# Patient Record
Sex: Female | Born: 1970 | Race: Black or African American | Hispanic: No | Marital: Single | Smoking: Never smoker
Health system: Southern US, Community
[De-identification: ages and names within clinical notes are randomized; demographics above are authoritative.]

## PROBLEM LIST (undated history)

## (undated) DIAGNOSIS — C50919 Malignant neoplasm of unspecified site of unspecified female breast: Secondary | ICD-10-CM

## (undated) HISTORY — PX: BREAST SURGERY: SHX581

## (undated) HISTORY — PX: BREAST EXCISIONAL BIOPSY: SUR124

---

## 2014-12-03 ENCOUNTER — Encounter (HOSPITAL_COMMUNITY): Payer: Self-pay | Admitting: *Deleted

## 2014-12-03 ENCOUNTER — Emergency Department (HOSPITAL_COMMUNITY): Payer: BLUE CROSS/BLUE SHIELD

## 2014-12-03 ENCOUNTER — Inpatient Hospital Stay (HOSPITAL_COMMUNITY)
Admission: EM | Admit: 2014-12-03 | Discharge: 2014-12-12 | DRG: 347 | Disposition: A | Discharge status: Home Health | Payer: BLUE CROSS/BLUE SHIELD | Attending: General Surgery | Admitting: General Surgery

## 2014-12-03 ENCOUNTER — Observation Stay (HOSPITAL_COMMUNITY): Payer: BLUE CROSS/BLUE SHIELD

## 2014-12-03 DIAGNOSIS — K458 Other specified abdominal hernia without obstruction or gangrene: Secondary | ICD-10-CM | POA: Diagnosis present

## 2014-12-03 DIAGNOSIS — M79672 Pain in left foot: Secondary | ICD-10-CM

## 2014-12-03 DIAGNOSIS — S36438A Laceration of other part of small intestine, initial encounter: Principal | ICD-10-CM | POA: Diagnosis present

## 2014-12-03 DIAGNOSIS — S36409A Unspecified injury of unspecified part of small intestine, initial encounter: Secondary | ICD-10-CM | POA: Diagnosis present

## 2014-12-03 DIAGNOSIS — Z853 Personal history of malignant neoplasm of breast: Secondary | ICD-10-CM

## 2014-12-03 DIAGNOSIS — F1010 Alcohol abuse, uncomplicated: Secondary | ICD-10-CM | POA: Diagnosis present

## 2014-12-03 DIAGNOSIS — K661 Hemoperitoneum: Secondary | ICD-10-CM | POA: Diagnosis present

## 2014-12-03 DIAGNOSIS — E876 Hypokalemia: Secondary | ICD-10-CM | POA: Diagnosis not present

## 2014-12-03 DIAGNOSIS — R748 Abnormal levels of other serum enzymes: Secondary | ICD-10-CM | POA: Diagnosis present

## 2014-12-03 DIAGNOSIS — K429 Umbilical hernia without obstruction or gangrene: Secondary | ICD-10-CM | POA: Diagnosis present

## 2014-12-03 DIAGNOSIS — R109 Unspecified abdominal pain: Secondary | ICD-10-CM

## 2014-12-03 DIAGNOSIS — S36899A Unspecified injury of other intra-abdominal organs, initial encounter: Secondary | ICD-10-CM | POA: Diagnosis present

## 2014-12-03 DIAGNOSIS — Z7289 Other problems related to lifestyle: Secondary | ICD-10-CM | POA: Diagnosis present

## 2014-12-03 DIAGNOSIS — R7401 Elevation of levels of liver transaminase levels: Secondary | ICD-10-CM

## 2014-12-03 DIAGNOSIS — Z789 Other specified health status: Secondary | ICD-10-CM | POA: Diagnosis present

## 2014-12-03 DIAGNOSIS — M25472 Effusion, left ankle: Secondary | ICD-10-CM | POA: Diagnosis present

## 2014-12-03 DIAGNOSIS — D62 Acute posthemorrhagic anemia: Secondary | ICD-10-CM | POA: Diagnosis not present

## 2014-12-03 DIAGNOSIS — R740 Nonspecific elevation of levels of transaminase and lactic acid dehydrogenase [LDH]: Secondary | ICD-10-CM | POA: Diagnosis present

## 2014-12-03 HISTORY — DX: Malignant neoplasm of unspecified site of unspecified female breast: C50.919

## 2014-12-03 LAB — URINE MICROSCOPIC-ADD ON

## 2014-12-03 LAB — CBC
HCT: 36.6 % (ref 36.0–46.0)
HEMATOCRIT: 34.8 % — AB (ref 36.0–46.0)
HEMOGLOBIN: 11.9 g/dL — AB (ref 12.0–15.0)
HEMOGLOBIN: 11.3 g/dL — AB (ref 12.0–15.0)
MCH: 28.2 pg (ref 26.0–34.0)
MCH: 27.9 pg (ref 26.0–34.0)
MCHC: 32.5 g/dL (ref 30.0–36.0)
MCHC: 32.5 g/dL (ref 30.0–36.0)
MCV: 86.7 fL (ref 78.0–100.0)
MCV: 85.9 fL (ref 78.0–100.0)
Platelets: 188 10*3/uL (ref 150–400)
Platelets: 165 10*3/uL (ref 150–400)
RBC: 4.22 MIL/uL (ref 3.87–5.11)
RBC: 4.05 MIL/uL (ref 3.87–5.11)
RDW: 13.3 % (ref 11.5–15.5)
RDW: 13.3 % (ref 11.5–15.5)
WBC: 8.4 10*3/uL (ref 4.0–10.5)
WBC: 2.8 10*3/uL — AB (ref 4.0–10.5)

## 2014-12-03 LAB — COMPREHENSIVE METABOLIC PANEL
ALK PHOS: 127 U/L — AB (ref 39–117)
ALT: 37 U/L — AB (ref 0–35)
AST: 61 U/L — ABNORMAL HIGH (ref 0–37)
Albumin: 3.6 g/dL (ref 3.5–5.2)
Anion gap: 20 — ABNORMAL HIGH (ref 5–15)
BUN: 14 mg/dL (ref 6–23)
CO2: 17 mEq/L — ABNORMAL LOW (ref 19–32)
Calcium: 9.4 mg/dL (ref 8.4–10.5)
Chloride: 102 mEq/L (ref 96–112)
Creatinine, Ser: 0.82 mg/dL (ref 0.50–1.10)
GFR calc Af Amer: >90 mL/min (ref >90)
GFR calc non Af Amer: 86 mL/min — ABNORMAL LOW (ref >90)
Glucose, Bld: 171 mg/dL — ABNORMAL HIGH (ref 70–99)
Potassium: 3.2 mEq/L — ABNORMAL LOW (ref 3.7–5.3)
SODIUM: 139 meq/L (ref 137–147)
TOTAL PROTEIN: 6.9 g/dL (ref 6.0–8.3)

## 2014-12-03 LAB — URINALYSIS, ROUTINE W REFLEX MICROSCOPIC
Bilirubin Urine: NEGATIVE
Glucose, UA: NEGATIVE mg/dL
KETONES UR: NEGATIVE mg/dL
LEUKOCYTES UA: NEGATIVE
NITRITE: NEGATIVE
Protein, ur: NEGATIVE mg/dL
Specific Gravity, Urine: 1.007 (ref 1.005–1.030)
Urobilinogen, UA: 0.2 mg/dL (ref 0.0–1.0)
pH: 5 (ref 5.0–8.0)

## 2014-12-03 LAB — PROTIME-INR
INR: 1.04 (ref 0.00–1.49)
Prothrombin Time: 13.7 seconds (ref 11.6–15.2)

## 2014-12-03 LAB — I-STAT CG4 LACTIC ACID, ED: Lactic Acid, Venous: 2.88 mmol/L — ABNORMAL HIGH (ref 0.5–2.2)

## 2014-12-03 LAB — SAMPLE TO BLOOD BANK

## 2014-12-03 LAB — ETHANOL: Alcohol, Ethyl (B): 54 mg/dL — ABNORMAL HIGH (ref 0–11)

## 2014-12-03 MED ORDER — POTASSIUM CHLORIDE IN NACL 20-0.9 MEQ/L-% IV SOLN
INTRAVENOUS | Status: DC
  Administered 2014-12-03 – 2014-12-10 (×15): via INTRAVENOUS
  Filled 2014-12-03 (×23): qty 1000

## 2014-12-03 MED ORDER — MORPHINE SULFATE 2 MG/ML IJ SOLN
1.0000 mg | INTRAMUSCULAR | Status: DC | PRN
  Administered 2014-12-03 (×3): 2 mg via INTRAVENOUS
  Administered 2014-12-04: 4 mg via INTRAVENOUS
  Administered 2014-12-04 – 2014-12-06 (×2): 2 mg via INTRAVENOUS
  Administered 2014-12-06 – 2014-12-07 (×7): 4 mg via INTRAVENOUS
  Administered 2014-12-08 – 2014-12-09 (×5): 2 mg via INTRAVENOUS
  Administered 2014-12-10: 4 mg via INTRAVENOUS
  Administered 2014-12-11: 2 mg via INTRAVENOUS
  Filled 2014-12-03: qty 2
  Filled 2014-12-03 (×5): qty 1
  Filled 2014-12-03 (×4): qty 2
  Filled 2014-12-03 (×3): qty 1
  Filled 2014-12-03 (×3): qty 2
  Filled 2014-12-03: qty 1
  Filled 2014-12-03 (×2): qty 2
  Filled 2014-12-03 (×2): qty 1

## 2014-12-03 MED ORDER — MORPHINE SULFATE 4 MG/ML IJ SOLN
4.0000 mg | Freq: Once | INTRAMUSCULAR | Status: AC
  Administered 2014-12-03: 4 mg via INTRAVENOUS
  Filled 2014-12-03: qty 1

## 2014-12-03 MED ORDER — METOCLOPRAMIDE HCL 5 MG/ML IJ SOLN
10.0000 mg | Freq: Once | INTRAMUSCULAR | Status: AC
  Administered 2014-12-03: 10 mg via INTRAVENOUS
  Filled 2014-12-03: qty 2

## 2014-12-03 MED ORDER — HYDROCODONE-ACETAMINOPHEN 5-325 MG PO TABS
1.0000 | ORAL_TABLET | Freq: Four times a day (QID) | ORAL | Status: DC | PRN
  Administered 2014-12-03 – 2014-12-04 (×2): 2 via ORAL
  Filled 2014-12-03 (×2): qty 2

## 2014-12-03 MED ORDER — PANTOPRAZOLE SODIUM 40 MG IV SOLR
40.0000 mg | Freq: Every day | INTRAVENOUS | Status: DC
  Administered 2014-12-05 – 2014-12-07 (×3): 40 mg via INTRAVENOUS
  Filled 2014-12-03 (×8): qty 40

## 2014-12-03 MED ORDER — HYDROMORPHONE HCL 1 MG/ML IJ SOLN
1.0000 mg | Freq: Once | INTRAMUSCULAR | Status: AC
  Administered 2014-12-03: 1 mg via INTRAVENOUS
  Filled 2014-12-03 (×2): qty 1

## 2014-12-03 MED ORDER — ONDANSETRON HCL 4 MG/2ML IJ SOLN
4.0000 mg | Freq: Four times a day (QID) | INTRAMUSCULAR | Status: DC | PRN
  Administered 2014-12-09 (×2): 4 mg via INTRAVENOUS
  Filled 2014-12-03 (×2): qty 2

## 2014-12-03 MED ORDER — IOHEXOL 300 MG/ML  SOLN
100.0000 mL | Freq: Once | INTRAMUSCULAR | Status: AC | PRN
  Administered 2014-12-03: 100 mL via INTRAVENOUS

## 2014-12-03 MED ORDER — SODIUM CHLORIDE 0.9 % IV SOLN
1000.0000 mL | Freq: Once | INTRAVENOUS | Status: AC
  Administered 2014-12-03: 1000 mL via INTRAVENOUS

## 2014-12-03 MED ORDER — PROMETHAZINE HCL 25 MG/ML IJ SOLN
25.0000 mg | Freq: Once | INTRAMUSCULAR | Status: AC
  Administered 2014-12-03: 25 mg via INTRAVENOUS
  Filled 2014-12-03 (×2): qty 1

## 2014-12-03 MED ORDER — DIPHENHYDRAMINE HCL 50 MG/ML IJ SOLN
25.0000 mg | Freq: Once | INTRAMUSCULAR | Status: AC
  Administered 2014-12-03: 25 mg via INTRAVENOUS
  Filled 2014-12-03: qty 1

## 2014-12-03 MED ORDER — PANTOPRAZOLE SODIUM 40 MG PO TBEC
40.0000 mg | DELAYED_RELEASE_TABLET | Freq: Every day | ORAL | Status: DC
  Administered 2014-12-03 – 2014-12-12 (×5): 40 mg via ORAL
  Filled 2014-12-03 (×5): qty 1

## 2014-12-03 MED ORDER — ONDANSETRON HCL 4 MG/2ML IJ SOLN
INTRAMUSCULAR | Status: AC
  Administered 2014-12-03: 4 mg
  Filled 2014-12-03: qty 2

## 2014-12-03 MED ORDER — ONDANSETRON HCL 4 MG/2ML IJ SOLN
4.0000 mg | Freq: Once | INTRAMUSCULAR | Status: AC
  Administered 2014-12-03: 4 mg via INTRAVENOUS
  Filled 2014-12-03: qty 2

## 2014-12-03 MED ORDER — DOCUSATE SODIUM 100 MG PO CAPS
100.0000 mg | ORAL_CAPSULE | Freq: Two times a day (BID) | ORAL | Status: DC
  Administered 2014-12-03 – 2014-12-12 (×13): 100 mg via ORAL
  Filled 2014-12-03 (×14): qty 1

## 2014-12-03 MED ORDER — SODIUM CHLORIDE 0.9 % IV SOLN
1000.0000 mL | INTRAVENOUS | Status: DC
Start: 2014-12-03 — End: 2014-12-05
  Administered 2014-12-03 – 2014-12-04 (×2): 1000 mL via INTRAVENOUS

## 2014-12-03 NOTE — ED Notes (Signed)
Received result lactic acid of 2.88

## 2014-12-03 NOTE — ED Notes (Signed)
The pot has been constantly voiding otwanting to.  She has been up and walking aroound the room.  Nausea better

## 2014-12-03 NOTE — ED Notes (Signed)
The pt returned from c-t.  Still n and v
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
## 2014-12-03 NOTE — ED Notes (Signed)
Patient returned from CT

## 2014-12-03 NOTE — ED Notes (Signed)
Pt c/o abd pain  vomitng

## 2014-12-03 NOTE — ED Notes (Signed)
Attempted report x1. 

## 2014-12-03 NOTE — ED Notes (Signed)
The pt just received zofran for   n and v on arrival
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
## 2014-12-03 NOTE — ED Notes (Signed)
To ct
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
## 2014-12-04 ENCOUNTER — Encounter (HOSPITAL_COMMUNITY): Payer: Self-pay | Admitting: Anesthesiology

## 2014-12-04 ENCOUNTER — Observation Stay (HOSPITAL_COMMUNITY): Payer: BLUE CROSS/BLUE SHIELD | Admitting: Anesthesiology

## 2014-12-04 ENCOUNTER — Encounter (HOSPITAL_COMMUNITY): Admission: EM | Disposition: A | Discharge status: Home Health | Payer: Self-pay | Source: Home / Self Care

## 2014-12-04 ENCOUNTER — Observation Stay (HOSPITAL_COMMUNITY): Payer: BLUE CROSS/BLUE SHIELD

## 2014-12-04 DIAGNOSIS — E876 Hypokalemia: Secondary | ICD-10-CM | POA: Diagnosis not present

## 2014-12-04 DIAGNOSIS — F1010 Alcohol abuse, uncomplicated: Secondary | ICD-10-CM | POA: Diagnosis present

## 2014-12-04 DIAGNOSIS — D62 Acute posthemorrhagic anemia: Secondary | ICD-10-CM | POA: Diagnosis not present

## 2014-12-04 DIAGNOSIS — R748 Abnormal levels of other serum enzymes: Secondary | ICD-10-CM | POA: Diagnosis present

## 2014-12-04 DIAGNOSIS — S36438A Laceration of other part of small intestine, initial encounter: Secondary | ICD-10-CM | POA: Diagnosis present

## 2014-12-04 DIAGNOSIS — Z853 Personal history of malignant neoplasm of breast: Secondary | ICD-10-CM | POA: Diagnosis not present

## 2014-12-04 DIAGNOSIS — K661 Hemoperitoneum: Secondary | ICD-10-CM | POA: Diagnosis present

## 2014-12-04 DIAGNOSIS — R740 Nonspecific elevation of levels of transaminase and lactic acid dehydrogenase [LDH]: Secondary | ICD-10-CM | POA: Diagnosis present

## 2014-12-04 HISTORY — PX: LAPAROSCOPIC BOWEL RESECTION: SUR754

## 2014-12-04 HISTORY — PX: BOWEL RESECTION: SHX1257

## 2014-12-04 HISTORY — PX: LAPAROTOMY: SHX154

## 2014-12-04 LAB — COMPREHENSIVE METABOLIC PANEL
ALT: 37 U/L — AB (ref 0–35)
AST: 89 U/L — ABNORMAL HIGH (ref 0–37)
Albumin: 2.8 g/dL — ABNORMAL LOW (ref 3.5–5.2)
Alkaline Phosphatase: 67 U/L (ref 39–117)
Anion gap: 14 (ref 5–15)
BUN: 11 mg/dL (ref 6–23)
CALCIUM: 8.8 mg/dL (ref 8.4–10.5)
CO2: 19 meq/L (ref 19–32)
CREATININE: 0.82 mg/dL (ref 0.50–1.10)
Chloride: 102 mEq/L (ref 96–112)
GFR calc Af Amer: >90 mL/min (ref >90)
GFR, EST NON AFRICAN AMERICAN: 86 mL/min — AB (ref >90)
Glucose, Bld: 100 mg/dL — ABNORMAL HIGH (ref 70–99)
Potassium: 3.9 mEq/L (ref 3.7–5.3)
Sodium: 135 mEq/L — ABNORMAL LOW (ref 137–147)
Total Bilirubin: 0.6 mg/dL (ref 0.3–1.2)
Total Protein: 5.8 g/dL — ABNORMAL LOW (ref 6.0–8.3)

## 2014-12-04 LAB — CBC
HCT: 30.4 % — ABNORMAL LOW (ref 36.0–46.0)
HCT: 32.4 % — ABNORMAL LOW (ref 36.0–46.0)
HEMOGLOBIN: 9.9 g/dL — AB (ref 12.0–15.0)
Hemoglobin: 10.7 g/dL — ABNORMAL LOW (ref 12.0–15.0)
MCH: 27.7 pg (ref 26.0–34.0)
MCH: 28.2 pg (ref 26.0–34.0)
MCHC: 32.6 g/dL (ref 30.0–36.0)
MCHC: 33 g/dL (ref 30.0–36.0)
MCV: 85.2 fL (ref 78.0–100.0)
MCV: 85.3 fL (ref 78.0–100.0)
Platelets: 143 10*3/uL — ABNORMAL LOW (ref 150–400)
Platelets: 149 10*3/uL — ABNORMAL LOW (ref 150–400)
RBC: 3.57 MIL/uL — AB (ref 3.87–5.11)
RBC: 3.8 MIL/uL — AB (ref 3.87–5.11)
RDW: 13.5 % (ref 11.5–15.5)
RDW: 13.6 % (ref 11.5–15.5)
WBC: 2.9 10*3/uL — ABNORMAL LOW (ref 4.0–10.5)
WBC: 2 10*3/uL — ABNORMAL LOW (ref 4.0–10.5)

## 2014-12-04 LAB — URINALYSIS, ROUTINE W REFLEX MICROSCOPIC
Bilirubin Urine: NEGATIVE
GLUCOSE, UA: NEGATIVE mg/dL
Ketones, ur: NEGATIVE mg/dL
Leukocytes, UA: NEGATIVE
Nitrite: NEGATIVE
PROTEIN: 30 mg/dL — AB
Specific Gravity, Urine: 1.026 (ref 1.005–1.030)
Urobilinogen, UA: 0.2 mg/dL (ref 0.0–1.0)
pH: 5 (ref 5.0–8.0)

## 2014-12-04 LAB — URINE MICROSCOPIC-ADD ON

## 2014-12-04 LAB — HEMOGLOBIN AND HEMATOCRIT, BLOOD
HCT: 29.4 % — ABNORMAL LOW (ref 36.0–46.0)
HEMOGLOBIN: 9.5 g/dL — AB (ref 12.0–15.0)

## 2014-12-04 LAB — SURGICAL PCR SCREEN
MRSA, PCR: NEGATIVE
Staphylococcus aureus: NEGATIVE

## 2014-12-04 SURGERY — LAPAROTOMY, EXPLORATORY
Anesthesia: General | Site: Abdomen

## 2014-12-04 MED ORDER — HYDROMORPHONE HCL 1 MG/ML IJ SOLN
0.2500 mg | INTRAMUSCULAR | Status: DC | PRN
  Administered 2014-12-04 (×4): 0.5 mg via INTRAVENOUS

## 2014-12-04 MED ORDER — SODIUM CHLORIDE 0.9 % IV BOLUS (SEPSIS): 500.0000 mL | Freq: Once | INTRAVENOUS | Status: DC

## 2014-12-04 MED ORDER — ONDANSETRON HCL 4 MG/2ML IJ SOLN
INTRAMUSCULAR | Status: AC
  Filled 2014-12-04: qty 2

## 2014-12-04 MED ORDER — ROCURONIUM BROMIDE 100 MG/10ML IV SOLN
INTRAVENOUS | Status: DC | PRN
  Administered 2014-12-04: 30 mg via INTRAVENOUS

## 2014-12-04 MED ORDER — ROCURONIUM BROMIDE 50 MG/5ML IV SOLN
INTRAVENOUS | Status: AC
  Filled 2014-12-04: qty 1

## 2014-12-04 MED ORDER — LIDOCAINE HCL (CARDIAC) 20 MG/ML IV SOLN
INTRAVENOUS | Status: DC | PRN
  Administered 2014-12-04: 40 mg via INTRAVENOUS

## 2014-12-04 MED ORDER — HYDROMORPHONE 0.3 MG/ML IV SOLN
INTRAVENOUS | Status: AC
  Filled 2014-12-04: qty 25

## 2014-12-04 MED ORDER — DEXMEDETOMIDINE HCL IN NACL 200 MCG/50ML IV SOLN
INTRAVENOUS | Status: AC
  Filled 2014-12-04: qty 50

## 2014-12-04 MED ORDER — PROPOFOL 10 MG/ML IV BOLUS
INTRAVENOUS | Status: AC
  Filled 2014-12-04: qty 20

## 2014-12-04 MED ORDER — LIDOCAINE HCL (CARDIAC) 20 MG/ML IV SOLN
INTRAVENOUS | Status: AC
  Filled 2014-12-04: qty 5

## 2014-12-04 MED ORDER — PIPERACILLIN-TAZOBACTAM 3.375 G IVPB
3.3750 g | Freq: Three times a day (TID) | INTRAVENOUS | Status: AC
  Administered 2014-12-04 – 2014-12-10 (×21): 3.375 g via INTRAVENOUS
  Filled 2014-12-04 (×23): qty 50

## 2014-12-04 MED ORDER — FENTANYL CITRATE 0.05 MG/ML IJ SOLN
INTRAMUSCULAR | Status: AC
  Filled 2014-12-04: qty 5

## 2014-12-04 MED ORDER — ONDANSETRON HCL 4 MG/2ML IJ SOLN: 4.0000 mg | Freq: Four times a day (QID) | INTRAMUSCULAR | Status: DC | PRN

## 2014-12-04 MED ORDER — ALBUMIN HUMAN 5 % IV SOLN
INTRAVENOUS | Status: DC | PRN
  Administered 2014-12-04 (×2): via INTRAVENOUS

## 2014-12-04 MED ORDER — NALOXONE HCL 0.4 MG/ML IJ SOLN: 0.4000 mg | INTRAMUSCULAR | Status: DC | PRN

## 2014-12-04 MED ORDER — 0.9 % SODIUM CHLORIDE (POUR BTL) OPTIME
TOPICAL | Status: DC | PRN
  Administered 2014-12-04 (×8): 1000 mL

## 2014-12-04 MED ORDER — ONDANSETRON HCL 4 MG/2ML IJ SOLN: 4.0000 mg | Freq: Once | INTRAMUSCULAR | Status: DC | PRN

## 2014-12-04 MED ORDER — FENTANYL CITRATE 0.05 MG/ML IJ SOLN
INTRAMUSCULAR | Status: DC | PRN
  Administered 2014-12-04: 75 ug via INTRAVENOUS
  Administered 2014-12-04: 125 ug via INTRAVENOUS
  Administered 2014-12-04: 50 ug via INTRAVENOUS

## 2014-12-04 MED ORDER — LACTATED RINGERS IV SOLN
INTRAVENOUS | Status: DC
  Administered 2014-12-04 (×2): via INTRAVENOUS

## 2014-12-04 MED ORDER — NEOSTIGMINE METHYLSULFATE 10 MG/10ML IV SOLN
INTRAVENOUS | Status: DC | PRN
  Administered 2014-12-04: 3 mg via INTRAVENOUS

## 2014-12-04 MED ORDER — HYDROMORPHONE HCL 1 MG/ML IJ SOLN
INTRAMUSCULAR | Status: AC
  Filled 2014-12-04: qty 1

## 2014-12-04 MED ORDER — OXYCODONE HCL 5 MG PO TABS: 5.0000 mg | ORAL_TABLET | Freq: Once | ORAL | Status: DC | PRN

## 2014-12-04 MED ORDER — MIDAZOLAM HCL 5 MG/5ML IJ SOLN
INTRAMUSCULAR | Status: DC | PRN
  Administered 2014-12-04: 2 mg via INTRAVENOUS

## 2014-12-04 MED ORDER — SUCCINYLCHOLINE CHLORIDE 20 MG/ML IJ SOLN
INTRAMUSCULAR | Status: DC | PRN
  Administered 2014-12-04: 100 mg via INTRAVENOUS

## 2014-12-04 MED ORDER — ONDANSETRON HCL 4 MG/2ML IJ SOLN
INTRAMUSCULAR | Status: DC | PRN
  Administered 2014-12-04: 4 mg via INTRAVENOUS

## 2014-12-04 MED ORDER — HYDROMORPHONE 0.3 MG/ML IV SOLN
INTRAVENOUS | Status: DC
  Administered 2014-12-04: via INTRAVENOUS
  Administered 2014-12-04: 0.3 mg via INTRAVENOUS
  Administered 2014-12-04: 1.2 mg via INTRAVENOUS
  Administered 2014-12-04: 3 mg via INTRAVENOUS
  Administered 2014-12-05: 2.7 mg via INTRAVENOUS
  Administered 2014-12-05: 3 mg via INTRAVENOUS
  Administered 2014-12-05: 2.4 mg via INTRAVENOUS
  Administered 2014-12-05: 12:00:00 via INTRAVENOUS
  Administered 2014-12-05: 1.8 mg via INTRAVENOUS
  Administered 2014-12-05: 2.7 mg via INTRAVENOUS
  Administered 2014-12-05: 3.6 mg via INTRAVENOUS
  Administered 2014-12-06: 01:00:00 via INTRAVENOUS
  Administered 2014-12-06: 1.5 mg via INTRAVENOUS
  Administered 2014-12-06: 2.4 mg via INTRAVENOUS
  Filled 2014-12-04 (×3): qty 25

## 2014-12-04 MED ORDER — SODIUM CHLORIDE 0.9 % IJ SOLN: 9.0000 mL | INTRAMUSCULAR | Status: DC | PRN

## 2014-12-04 MED ORDER — PROPOFOL 10 MG/ML IV BOLUS
INTRAVENOUS | Status: DC | PRN
  Administered 2014-12-04: 180 mg via INTRAVENOUS

## 2014-12-04 MED ORDER — DIPHENHYDRAMINE HCL 12.5 MG/5ML PO ELIX: 12.5000 mg | ORAL_SOLUTION | Freq: Four times a day (QID) | ORAL | Status: DC | PRN

## 2014-12-04 MED ORDER — MIDAZOLAM HCL 2 MG/2ML IJ SOLN
INTRAMUSCULAR | Status: AC
  Filled 2014-12-04: qty 2

## 2014-12-04 MED ORDER — OXYCODONE HCL 5 MG/5ML PO SOLN: 5.0000 mg | Freq: Once | ORAL | Status: DC | PRN

## 2014-12-04 MED ORDER — SODIUM CHLORIDE 0.9 % IV SOLN
10.0000 mg | INTRAVENOUS | Status: DC | PRN
  Administered 2014-12-04: 25 ug/min via INTRAVENOUS

## 2014-12-04 MED ORDER — ACETAMINOPHEN 650 MG RE SUPP
650.0000 mg | RECTAL | Status: DC | PRN
  Administered 2014-12-06 – 2014-12-08 (×3): 650 mg via RECTAL
  Filled 2014-12-04 (×3): qty 1

## 2014-12-04 MED ORDER — GLYCOPYRROLATE 0.2 MG/ML IJ SOLN
INTRAMUSCULAR | Status: DC | PRN
  Administered 2014-12-04: 0.4 mg via INTRAVENOUS

## 2014-12-04 MED ORDER — GLYCOPYRROLATE 0.2 MG/ML IJ SOLN
INTRAMUSCULAR | Status: AC
  Filled 2014-12-04: qty 2

## 2014-12-04 MED ORDER — DIPHENHYDRAMINE HCL 50 MG/ML IJ SOLN: 12.5000 mg | Freq: Four times a day (QID) | INTRAMUSCULAR | Status: DC | PRN

## 2014-12-04 SURGICAL SUPPLY — 45 items
BNDG GAUZE ELAST 4 BULKY (GAUZE/BANDAGES/DRESSINGS) ×3 IMPLANT
CANISTER SUCTION 2500CC (MISCELLANEOUS) ×3 IMPLANT
CHLORAPREP W/TINT 26ML (MISCELLANEOUS) ×3 IMPLANT
COVER SURGICAL LIGHT HANDLE (MISCELLANEOUS) ×3 IMPLANT
DRAPE LAPAROSCOPIC ABDOMINAL (DRAPES) ×3 IMPLANT
DRAPE PROXIMA HALF (DRAPES) IMPLANT
DRAPE UTILITY XL STRL (DRAPES) ×6 IMPLANT
DRAPE WARM FLUID 44X44 (DRAPE) ×3 IMPLANT
ELECT BLADE 6.5 EXT (BLADE) ×3 IMPLANT
ELECT CAUTERY BLADE 6.4 (BLADE) ×6 IMPLANT
ELECT REM PT RETURN 9FT ADLT (ELECTROSURGICAL) ×3
ELECTRODE REM PT RTRN 9FT ADLT (ELECTROSURGICAL) ×1 IMPLANT
GLOVE BIO SURGEON STRL SZ7 (GLOVE) ×3 IMPLANT
GLOVE BIO SURGEON STRL SZ7.5 (GLOVE) ×6 IMPLANT
GLOVE BIO SURGEON STRL SZ8 (GLOVE) ×9 IMPLANT
GLOVE BIOGEL PI IND STRL 8 (GLOVE) ×3 IMPLANT
GLOVE BIOGEL PI INDICATOR 8 (GLOVE) ×6
GOWN STRL REUS W/ TWL LRG LVL3 (GOWN DISPOSABLE) ×2 IMPLANT
GOWN STRL REUS W/ TWL XL LVL3 (GOWN DISPOSABLE) ×1 IMPLANT
GOWN STRL REUS W/TWL LRG LVL3 (GOWN DISPOSABLE) ×4
GOWN STRL REUS W/TWL XL LVL3 (GOWN DISPOSABLE) ×2
KIT BASIN OR (CUSTOM PROCEDURE TRAY) ×3 IMPLANT
KIT ROOM TURNOVER OR (KITS) ×3 IMPLANT
LIGASURE IMPACT 36 18CM CVD LR (INSTRUMENTS) ×3 IMPLANT
NS IRRIG 1000ML POUR BTL (IV SOLUTION) ×6 IMPLANT
PACK GENERAL/GYN (CUSTOM PROCEDURE TRAY) ×3 IMPLANT
PAD ABD 8X10 STRL (GAUZE/BANDAGES/DRESSINGS) ×3 IMPLANT
PAD ARMBOARD 7.5X6 YLW CONV (MISCELLANEOUS) ×3 IMPLANT
RELOAD PROXIMATE 75MM BLUE (ENDOMECHANICALS) ×6 IMPLANT
SPECIMEN JAR LARGE (MISCELLANEOUS) ×3 IMPLANT
SPONGE GAUZE 4X4 12PLY STER LF (GAUZE/BANDAGES/DRESSINGS) ×3 IMPLANT
SPONGE LAP 18X18 X RAY DECT (DISPOSABLE) ×6 IMPLANT
STAPLER GUN LINEAR PROX 60 (STAPLE) ×3 IMPLANT
STAPLER PROXIMATE 75MM BLUE (STAPLE) ×3 IMPLANT
STAPLER VISISTAT 35W (STAPLE) ×3 IMPLANT
SUCTION POOLE TIP (SUCTIONS) ×3 IMPLANT
SUT PDS AB 1 TP1 96 (SUTURE) ×6 IMPLANT
SUT SILK 2 0 SH CR/8 (SUTURE) ×3 IMPLANT
SUT SILK 2 0 TIES 10X30 (SUTURE) ×3 IMPLANT
SUT SILK 3 0 SH CR/8 (SUTURE) ×3 IMPLANT
SUT SILK 3 0 TIES 10X30 (SUTURE) ×3 IMPLANT
TAPE CLOTH SURG 6X10 WHT LF (GAUZE/BANDAGES/DRESSINGS) ×3 IMPLANT
TOWEL OR 17X26 10 PK STRL BLUE (TOWEL DISPOSABLE) ×3 IMPLANT
TRAY FOLEY CATH 16FRSI W/METER (SET/KITS/TRAYS/PACK) ×3 IMPLANT
YANKAUER SUCT BULB TIP NO VENT (SUCTIONS) IMPLANT

## 2014-12-04 NOTE — Significant Event (Signed)
Rapid Response Event Note  Overview: Time Called: 0440 Arrival Time: 0505 Event Type: Respiratory, Other (Comment) (Abd pain)  Initial Focused Assessment: Awaked with c/o lower abd pain and shortness of breath.   Interventions: Bladder scan had about 75-100cc   Event Summary: Patient reported waking up with shortness of breath, diaphoresis, and mid lower abd pain. Breathing did improve with repositioning and O2. The pain to lower abd was intermittent and was rated a 8/10 on pain scale. Patient was given Morphine 2mg  IV for pain. Afebrile. Heart sounds regular and tachycardic at 114. Skin is warm and dry. Lungs are decreased bilateral. Advised unit RN to notify MD of patients complaints. No RRT protocols are needed at this time. We will continue to assist with care as needed.      at          [place]

## 2014-12-04 NOTE — Progress Notes (Signed)
UR completed 
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
## 2014-12-04 NOTE — Transfer of Care (Signed)
Immediate Anesthesia Transfer of Care Note  Patient: Karen Valdez  Procedure(s) Performed: Procedure(s): EXPLORATORY LAPAROTOMY (N/A) SMALL BOWEL RESECTION (N/A)  Patient Location: PACU  Anesthesia Type:General  Level of Consciousness: awake, alert  and oriented  Airway & Oxygen Therapy: Patient Spontanous Breathing and Patient connected to face mask oxygen  Post-op Assessment: Report given to PACU RN, Post -op Vital signs reviewed and stable and Patient moving all extremities  Post vital signs: Reviewed and stable  Complications: No apparent anesthesia complications
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
## 2014-12-04 NOTE — Progress Notes (Signed)
SLP Cancellation Note  Patient Details Name: Karen Valdez MRN: [id number] DOB: Apr 13, 1971   Cancelled treatment:       Reason Eval/Treat Not Completed: Patient at procedure or test/unavailable    Germain Osgood, M.A. CCC-SLP [phone]  Germain Osgood 12/04/2014, 11:55 AM
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
## 2014-12-04 NOTE — Progress Notes (Signed)
RN called into room by pt c/o of sob. Pt was diaphoretic. VS temp 99.9 orally, bp 101/76; hr 130s apically, 22 rr & 99% on RA. Pt has also been c/o of increased lower abd pain. The pain was initially dull now it's cramping. Bladder scan ranging from 70-106. Urine amber otherwise WNL. EKG showed ST. RRRN came to the room to assess the pt. Pt was given 2mg  of morphine IV & started on 2L [place]. The pt's sob improved & the abdl pain did as well initially but the pain came back. Pt states the pain in intermittent. Pt's abd is slightly more distended than upon the prior assessment however pt states she didn't notice a change in the size. MD on call was notified & new orders for the pt to be npo as well as a 2 view KUB stat. Will continue to monitor the pt. Hoover Brunette, RN

## 2014-12-04 NOTE — Anesthesia Procedure Notes (Signed)
Procedure Name: Intubation Date/Time: 12/04/2014 9:35 AM Performed by: Kyung Rudd Pre-anesthesia Checklist: Patient identified, Emergency Drugs available, Suction available, Patient being monitored and Timeout performed Patient Re-evaluated:Patient Re-evaluated prior to inductionOxygen Delivery Method: Circle system utilized Intubation Type: Rapid sequence and Cricoid Pressure applied Laryngoscope Size: Mac and 4 Grade View: Grade II Tube type: Oral Tube size: 7.5 mm Number of attempts: 2 Airway Equipment and Method: Bougie stylet Placement Confirmation: ETT inserted through vocal cords under direct vision,  positive ETCO2 and breath sounds checked- equal and bilateral Secured at: 21 cm Tube secured with: Tape Dental Injury: Teeth and Oropharynx as per pre-operative assessment  Comments: DL x1 with MAC 4, Grade II view per CRNA, ETT in esophagus. 2nd DL per MDA with MAC 4, unable to pass ETT, used blue bougie for AOI. +ETCO2 & BBS=.

## 2014-12-04 NOTE — Anesthesia Postprocedure Evaluation (Signed)
  Anesthesia Post-op Note  Patient: Karen Valdez  Procedure(s) Performed: Procedure(s): EXPLORATORY LAPAROTOMY (N/A) SMALL BOWEL RESECTION (N/A)  Patient Location: PACU  Anesthesia Type:General  Level of Consciousness: awake, alert  and oriented  Airway and Oxygen Therapy: Patient Spontanous Breathing and Patient connected to nasal cannula oxygen  Post-op Pain: mild  Post-op Assessment: Post-op Vital signs reviewed, Patient's Cardiovascular Status Stable, Respiratory Function Stable, Patent Airway, No signs of Nausea or vomiting and Pain level controlled  Post-op Vital Signs: stable  Last Vitals:  Filed Vitals:   12/04/14 1838  BP: 122/68  Pulse: 101  Temp: 37.2 C  Resp: 18    Complications: No apparent anesthesia complications
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
## 2014-12-05 ENCOUNTER — Encounter (HOSPITAL_COMMUNITY): Payer: Self-pay | Admitting: General Surgery

## 2014-12-05 LAB — HEPATIC FUNCTION PANEL
ALT: 27 U/L (ref 0–35)
AST: 63 U/L — AB (ref 0–37)
Albumin: 2.4 g/dL — ABNORMAL LOW (ref 3.5–5.2)
Alkaline Phosphatase: 54 U/L (ref 39–117)
BILIRUBIN TOTAL: 0.5 mg/dL (ref 0.3–1.2)
Bilirubin, Direct: <0.2 mg/dL (ref 0.0–0.3)
Total Protein: 5.3 g/dL — ABNORMAL LOW (ref 6.0–8.3)

## 2014-12-05 LAB — CBC
HCT: 27.5 % — ABNORMAL LOW (ref 36.0–46.0)
Hemoglobin: 9.1 g/dL — ABNORMAL LOW (ref 12.0–15.0)
MCH: 28.5 pg (ref 26.0–34.0)
MCHC: 33.1 g/dL (ref 30.0–36.0)
MCV: 86.2 fL (ref 78.0–100.0)
PLATELETS: 141 10*3/uL — AB (ref 150–400)
RBC: 3.19 MIL/uL — AB (ref 3.87–5.11)
RDW: 13.5 % (ref 11.5–15.5)
WBC: 5.3 10*3/uL (ref 4.0–10.5)

## 2014-12-05 LAB — BASIC METABOLIC PANEL
ANION GAP: 10 (ref 5–15)
BUN: 9 mg/dL (ref 6–23)
CO2: 22 mEq/L (ref 19–32)
CREATININE: 0.97 mg/dL (ref 0.50–1.10)
Calcium: 8.4 mg/dL (ref 8.4–10.5)
Chloride: 107 mEq/L (ref 96–112)
GFR calc Af Amer: 82 mL/min — ABNORMAL LOW (ref >90)
GFR calc non Af Amer: 71 mL/min — ABNORMAL LOW (ref >90)
Glucose, Bld: 93 mg/dL (ref 70–99)
Potassium: 4.2 mEq/L (ref 3.7–5.3)
Sodium: 139 mEq/L (ref 137–147)

## 2014-12-05 MED ORDER — PANTOPRAZOLE SODIUM 40 MG IV SOLR: 40.0000 mg | Freq: Every day | INTRAVENOUS | Status: DC

## 2014-12-05 MED ORDER — METOPROLOL TARTRATE 1 MG/ML IV SOLN
5.0000 mg | Freq: Four times a day (QID) | INTRAVENOUS | Status: DC
  Administered 2014-12-05 – 2014-12-12 (×27): 5 mg via INTRAVENOUS
  Filled 2014-12-05 (×34): qty 5

## 2014-12-05 NOTE — Progress Notes (Signed)
Called by primary RN to see pt for CP.  EKG with no changes compared to old.  Pt's pain relieved with pain meds and changing of suction on NGT.  Pt pain free by my assessment.
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
## 2014-12-05 NOTE — Progress Notes (Signed)
0005 Patient alert and oriented. Heart rated very tachy ranging from 125 to 140. Vital signs are HR 132 regular R 24 unlabored B/P 123/74 O2 sat 97% on 2l/m nasal cannula. Dr. Donne Hazel notified and orders received. EKG done and shows sinus tachycardia HR 112. 0042 Metoprolol 5mg  given IV. 0130 Patient resting heart rate 118 to 120. Will continue to monitor
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
## 2014-12-05 NOTE — Progress Notes (Signed)
0230 Patient was awaken by chest discomfort and pressure. Patient state I feel like I need to burp but I can't. My chest really hurts. Vital signs are P120 R24 B/P 131/77 O2 sats 100 % on 2l/m nasal cannula. Encourage patient to use her PCA.  Rapid response nurse call to take a look at the patient. Rapid Response nurse wanted another EKG. EKG done and showed sinus tachycardia.[id number] did get some relief for her PCA dilaudid. Rapid Response Nurse up to see patient feeling better. 0330 Patient resting heart rate 120. Will continue to monitor.

## 2014-12-06 LAB — TYPE AND SCREEN
ABO/RH(D): O POS
ANTIBODY SCREEN: NEGATIVE

## 2014-12-06 LAB — COMPREHENSIVE METABOLIC PANEL
ALT: 30 U/L (ref 0–35)
ANION GAP: 10 (ref 5–15)
AST: 66 U/L — ABNORMAL HIGH (ref 0–37)
Albumin: 2.2 g/dL — ABNORMAL LOW (ref 3.5–5.2)
Alkaline Phosphatase: 71 U/L (ref 39–117)
BILIRUBIN TOTAL: 0.6 mg/dL (ref 0.3–1.2)
BUN: 9 mg/dL (ref 6–23)
CALCIUM: 8 mg/dL — AB (ref 8.4–10.5)
CHLORIDE: 108 meq/L (ref 96–112)
CO2: 20 mEq/L (ref 19–32)
Creatinine, Ser: 0.77 mg/dL (ref 0.50–1.10)
GFR calc Af Amer: >90 mL/min (ref >90)
GFR calc non Af Amer: >90 mL/min (ref >90)
Glucose, Bld: 70 mg/dL (ref 70–99)
Potassium: 4.6 mEq/L (ref 3.7–5.3)
Sodium: 138 mEq/L (ref 137–147)
Total Protein: 5.3 g/dL — ABNORMAL LOW (ref 6.0–8.3)

## 2014-12-06 LAB — CBC
HCT: 23.4 % — ABNORMAL LOW (ref 36.0–46.0)
Hemoglobin: 7.8 g/dL — ABNORMAL LOW (ref 12.0–15.0)
MCH: 29.2 pg (ref 26.0–34.0)
MCHC: 33.3 g/dL (ref 30.0–36.0)
MCV: 87.6 fL (ref 78.0–100.0)
PLATELETS: 140 10*3/uL — AB (ref 150–400)
RBC: 2.67 MIL/uL — AB (ref 3.87–5.11)
RDW: 13.9 % (ref 11.5–15.5)
WBC: 8.3 10*3/uL (ref 4.0–10.5)

## 2014-12-06 LAB — ABO/RH: ABO/RH(D): O POS

## 2014-12-06 LAB — TROPONIN I: Troponin I: <0.3 ng/mL (ref <0.30)

## 2014-12-06 MED ORDER — OXYCODONE-ACETAMINOPHEN 5-325 MG PO TABS
1.0000 | ORAL_TABLET | ORAL | Status: DC | PRN
  Administered 2014-12-08 (×2): 2 via ORAL
  Administered 2014-12-09: 1 via ORAL
  Administered 2014-12-11 – 2014-12-12 (×3): 2 via ORAL
  Filled 2014-12-06 (×7): qty 2

## 2014-12-06 MED ORDER — SODIUM CHLORIDE 0.9 % IJ SOLN
10.0000 mL | INTRAMUSCULAR | Status: DC | PRN
  Administered 2014-12-06 – 2014-12-12 (×4): 10 mL
  Filled 2014-12-06 (×4): qty 40

## 2014-12-06 MED ORDER — METHOCARBAMOL 1000 MG/10ML IJ SOLN
1000.0000 mg | Freq: Three times a day (TID) | INTRAVENOUS | Status: DC | PRN
  Filled 2014-12-06 (×2): qty 10

## 2014-12-06 MED ORDER — MENTHOL 3 MG MT LOZG
1.0000 | LOZENGE | OROMUCOSAL | Status: DC | PRN
  Filled 2014-12-06: qty 9

## 2014-12-06 NOTE — Progress Notes (Signed)
IV and lab having trouble with getting access.  She has previously had PICC and Port for chemo therapy for her breast cancer.  I will order another PICC, they have not been able to get labs for tropnin order earlier this AM. 1st Tropnin is normal.
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
## 2014-12-06 NOTE — Progress Notes (Signed)
Peripherally Inserted Central Catheter/Midline Placement  The IV Nurse has discussed with the patient and/or persons authorized to consent for the patient, the purpose of this procedure and the potential benefits and risks involved with this procedure.  The benefits include less needle sticks, lab draws from the catheter and patient may be discharged home with the catheter.  Risks include, but not limited to, infection, bleeding, blood clot (thrombus formation), and puncture of an artery; nerve damage and irregular heat beat.  Alternatives to this procedure were also discussed.  PICC/Midline Placement Documentation        Karen Valdez 12/06/2014, 5:15 PM
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
## 2014-12-07 DIAGNOSIS — S36409A Unspecified injury of unspecified part of small intestine, initial encounter: Secondary | ICD-10-CM | POA: Diagnosis present

## 2014-12-07 DIAGNOSIS — D62 Acute posthemorrhagic anemia: Secondary | ICD-10-CM | POA: Diagnosis not present

## 2014-12-07 LAB — BASIC METABOLIC PANEL
ANION GAP: 16 — AB (ref 5–15)
BUN: 9 mg/dL (ref 6–23)
CHLORIDE: 107 meq/L (ref 96–112)
CO2: 19 mEq/L (ref 19–32)
Calcium: 8.4 mg/dL (ref 8.4–10.5)
Creatinine, Ser: 0.74 mg/dL (ref 0.50–1.10)
GFR calc non Af Amer: >90 mL/min (ref >90)
Glucose, Bld: 77 mg/dL (ref 70–99)
Potassium: 3.7 mEq/L (ref 3.7–5.3)
SODIUM: 142 meq/L (ref 137–147)

## 2014-12-07 LAB — CBC
HEMATOCRIT: 22.2 % — AB (ref 36.0–46.0)
HEMOGLOBIN: 7.3 g/dL — AB (ref 12.0–15.0)
MCH: 28 pg (ref 26.0–34.0)
MCHC: 32.9 g/dL (ref 30.0–36.0)
MCV: 85.1 fL (ref 78.0–100.0)
Platelets: 144 10*3/uL — ABNORMAL LOW (ref 150–400)
RBC: 2.61 MIL/uL — ABNORMAL LOW (ref 3.87–5.11)
RDW: 13.7 % (ref 11.5–15.5)
WBC: 8.2 10*3/uL (ref 4.0–10.5)

## 2014-12-07 MED ORDER — ENOXAPARIN SODIUM 30 MG/0.3ML ~~LOC~~ SOLN
30.0000 mg | Freq: Two times a day (BID) | SUBCUTANEOUS | Status: DC
  Administered 2014-12-07 – 2014-12-11 (×9): 30 mg via SUBCUTANEOUS
  Filled 2014-12-07 (×14): qty 0.3
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
## 2014-12-07 NOTE — Progress Notes (Signed)
UR completed. Therapy recommendations are for [facility] at d/c. Pt uninsured so will have to meet Medicaid level criteria for [facility] needs as that is the standard set by the [facility] agency.  Sandi Mariscal, RN BSN [place] CCM Trauma/Neuro ICU Case Manager [phone]

## 2014-12-07 NOTE — Progress Notes (Signed)
Wyatt MD on call notified of Hgb 7.3. No new orders at this time, will continue to monitor pt.   Prescilla Sours, Therapist, sports

## 2014-12-07 NOTE — Consult Note (Addendum)
WOC wound consult note Reason for Consult: Consult requested to apply Vac to abd wound. Trauma service following for assessment and plan of care. Wound type: Full thickness post-op wound Measurement: 13X2X.3cm Wound bed: 100% beefy red Drainage (amount, consistency, odor) Mod amt yellow drainage, no odor Periwound: Intact skin surrounding Dressing procedure/placement/frequency: Applied one piece of black sponge to 142mm cont suction.  Pt tolerated with minimal pain. Plan for bedside nurse to change Q Tues/Thurs/Sat.  Please re-consult if further assistance is needed.  Thank-you,  Julien Girt MSN, [place], [place], [place], [place]
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
## 2014-12-08 LAB — CBC
HCT: 22.3 % — ABNORMAL LOW (ref 36.0–46.0)
Hemoglobin: 7.6 g/dL — ABNORMAL LOW (ref 12.0–15.0)
MCH: 29.2 pg (ref 26.0–34.0)
MCHC: 34.1 g/dL (ref 30.0–36.0)
MCV: 85.8 fL (ref 78.0–100.0)
PLATELETS: 164 10*3/uL (ref 150–400)
RBC: 2.6 MIL/uL — AB (ref 3.87–5.11)
RDW: 13.8 % (ref 11.5–15.5)
WBC: 8.8 10*3/uL (ref 4.0–10.5)
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
## 2014-12-08 NOTE — Progress Notes (Signed)
PT Cancellation Note  Patient Details Name: Karen Valdez MRN: [id number] DOB: 18-Jul-1971   Cancelled Treatment:    Reason Eval/Treat Not Completed: Fatigue/lethargy limiting ability to participate. Attempted to see patient again this afternoon however patient stated that she wished to rest but that she would walk with nursing staff later this evening. Will follpw up on Monday with PT   Robinette, Tonia Brooms 12/08/2014, 1:58 PM
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
## 2014-12-08 NOTE — Progress Notes (Signed)
PT Cancellation Note  Patient Details Name: Karen Valdez MRN: [id number] DOB: 01-27-1971   Cancelled Treatment:    Reason Eval/Treat Not Completed: Fatigue/lethargy limiting ability to participate;Pain limiting ability to participate. Patient is having increased stools this AM and is having some more pain. Politely declining therapy at this time. Will follow up as able later this afternoon   Jacqualyn Posey 12/08/2014, 10:27 AM

## 2014-12-09 LAB — CLOSTRIDIUM DIFFICILE BY PCR: Toxigenic C. Difficile by PCR: NEGATIVE

## 2014-12-09 MED ORDER — WHITE PETROLATUM GEL
Status: AC
  Filled 2014-12-09: qty 5

## 2014-12-09 MED ORDER — ACETAMINOPHEN 325 MG PO TABS
650.0000 mg | ORAL_TABLET | Freq: Four times a day (QID) | ORAL | Status: DC | PRN
  Administered 2014-12-09: 650 mg via ORAL
  Filled 2014-12-09 (×2): qty 2
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
## 2014-12-11 ENCOUNTER — Inpatient Hospital Stay (HOSPITAL_COMMUNITY): Payer: BLUE CROSS/BLUE SHIELD

## 2014-12-11 DIAGNOSIS — M25472 Effusion, left ankle: Secondary | ICD-10-CM | POA: Diagnosis present

## 2014-12-11 DIAGNOSIS — S36899A Unspecified injury of other intra-abdominal organs, initial encounter: Secondary | ICD-10-CM | POA: Diagnosis present

## 2014-12-11 DIAGNOSIS — Z789 Other specified health status: Secondary | ICD-10-CM | POA: Diagnosis present

## 2014-12-11 DIAGNOSIS — K458 Other specified abdominal hernia without obstruction or gangrene: Secondary | ICD-10-CM | POA: Diagnosis present

## 2014-12-11 DIAGNOSIS — Z7289 Other problems related to lifestyle: Secondary | ICD-10-CM | POA: Diagnosis present

## 2014-12-11 DIAGNOSIS — K429 Umbilical hernia without obstruction or gangrene: Secondary | ICD-10-CM | POA: Diagnosis present

## 2014-12-11 MED ORDER — OXYCODONE-ACETAMINOPHEN 5-325 MG PO TABS: 1.0000 | ORAL_TABLET | Freq: Four times a day (QID) | ORAL | Status: AC | PRN

## 2014-12-11 MED ORDER — DSS 100 MG PO CAPS: 100.0000 mg | ORAL_CAPSULE | Freq: Two times a day (BID) | ORAL | Status: AC | PRN

## 2014-12-11 NOTE — Discharge Instructions (Signed)
[facility]      [facility] [phone]  OPEN ABDOMINAL SURGERY: POST OP INSTRUCTIONS  Always review your discharge instruction sheet given to you by the facility where your surgery was performed.  IF YOU HAVE DISABILITY OR FAMILY LEAVE FORMS, YOU MUST BRING THEM TO THE OFFICE FOR PROCESSING.  PLEASE DO NOT GIVE THEM TO YOUR DOCTOR.  1. A prescription for pain medication may be given to you upon discharge.  Take your pain medication as prescribed, if needed.  If narcotic pain medicine is not needed, then you may take acetaminophen (Tylenol) or ibuprofen (Advil) as needed. 2. Take your usually prescribed medications unless otherwise directed. 3. If you need a refill on your pain medication, please contact your pharmacy. They will contact our office to request authorization.  Prescriptions will not be filled after 5pm or on week-ends. 4. You should follow a light diet the first few days after arrival home, such as soup and crackers, pudding, etc.unless your doctor has advised otherwise. A high-fiber, low fat diet can be resumed as tolerated.   Be sure to include lots of fluids daily. Most patients will experience some swelling and bruising on the chest and neck area.  Ice packs will help.  Swelling and bruising can take several days to resolve 5. Most patients will experience some swelling and bruising in the area of the incision. Ice pack will help. Swelling and bruising can take several days to resolve..  6. It is common to experience some constipation if taking pain medication after surgery.  Increasing fluid intake and taking a stool softener will usually help or prevent this problem from occurring.  A mild laxative (Milk of Magnesia or Miralax) should be taken according to package directions if there are no bowel movements after 48 hours. 7.  You may have steri-strips (small skin tapes) in place directly over the incision.  These strips should be left on the skin for 7-10 days.  If your  surgeon used skin glue on the incision, you may shower in 24 hours.  The glue will flake off over the next 2-3 weeks.  Any sutures or staples will be removed at the office during your follow-up visit. You may find that a light gauze bandage over your incision may keep your staples from being rubbed or pulled. You may shower and replace the bandage daily. 8. ACTIVITIES:  You may resume regular (light) daily activities beginning the next day--such as daily self-care, walking, climbing stairs--gradually increasing activities as tolerated.  You may have sexual intercourse when it is comfortable.  Refrain from any heavy lifting or straining until approved by your doctor. a. You may drive when you no longer are taking prescription pain medication, you can comfortably wear a seatbelt, and you can safely maneuver your car and apply brakes b. Return to Work: ___________________________________ 9. You should see your doctor in the office for a follow-up appointment approximately two weeks after your surgery.  Make sure that you call for this appointment within a day or two after you arrive home to insure a convenient appointment time. OTHER INSTRUCTIONS:  _____________________________________________________________ _____________________________________________________________  WHEN TO CALL YOUR DOCTOR: 1. Fever over 101.0 2. Inability to urinate 3. Nausea and/or vomiting 4. Extreme swelling or bruising 5. Continued bleeding from incision. 6. Increased pain, redness, or drainage from the incision. 7. Difficulty swallowing or breathing 8. Muscle cramping or spasms. 9. Numbness or tingling in hands or feet or around lips.  The clinic staff is available to   answer your questions during regular business hours.  Please don't hesitate to call and ask to speak to one of the nurses if you have concerns.  For further questions, please visit [email or web address]   
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
## 2014-12-11 NOTE — Progress Notes (Signed)
Patient just had wound vac supplies delivered for home use and  After talking with patient she would lkie to have regualr dressing change as per schedule tomorrow from wound nurse before d/c to home due to late delivery.

## 2014-12-11 NOTE — Progress Notes (Signed)
Pt arranged for [facility] for VAC dressing changes at home. HHRN arranged with [facility] per pt chocie and insurance options. Address and phone confirmed in EPIC as correct. VAC application to be sent pending MD signature.   Sandi Mariscal, RN BSN MHA CCM  Case Manager, Trauma Service/Unit [facility] [phone]

## 2014-12-12 NOTE — Progress Notes (Signed)
Discussed discharge summary with patient. Reviewed all medications with patient. Patient received Rx. Patient had no complications following PICC removal. All patient equipment at bedside and sent home with patient. Patient wound Vac changed to personal home vac. Patient ready for discharge.

## 2014-12-12 NOTE — Progress Notes (Signed)
PT Cancellation Note  Patient Details Name: Delsa Walder MRN: [id number] DOB: [birth year]   Cancelled Treatment:    Reason Eval/Treat Not Completed: Other (comment). Patient did not end up discharging last night due to late approval and delivery of wound vac. Followed up with patient this AM and she stated that she had no questions and no further need for PT today. PLanning to DC sometime this AM   [place], Tonia Brooms 12/12/2014, 8:43 AM

## 2014-12-12 NOTE — Progress Notes (Signed)
PICC line removal per MD. Pressure dressing applied,no bleeding noted at site,patient tolerated procedure well. Instructed patient to remain supine 30 mins. after PICC removal to avoid complications and that pressure dressing to remain dry and intact for 24 hrs. Tasia Catchings RN [facility].
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
## 2014-12-12 NOTE — Consult Note (Addendum)
WOC follow-up: Spoke with bedside nurse regarding dressing changes to abd by staff nurses.  She states Vac dressing was changed yesterday.  Pt ready for discharge and does not require another dressing change today. Please re-consult if further assistance is needed.  Thank-you,  Julien Girt MSN, [place], [place], [place], [place]
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
